# Patient Record
Sex: Female | Born: 1995 | Hispanic: No | Marital: Single
Health system: Southern US, Community
[De-identification: ages and names within clinical notes are randomized; demographics above are authoritative.]

---
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
## 2013-02-21 ENCOUNTER — Emergency Department: Payer: Self-pay | Admitting: Emergency Medicine

## 2013-02-21 LAB — URINALYSIS, COMPLETE
Bilirubin,UR: NEGATIVE
Glucose,UR: NEGATIVE mg/dL (ref 0–75)
Leukocyte Esterase: NEGATIVE
Nitrite: NEGATIVE
Ph: 6 (ref 4.5–8.0)
Protein: NEGATIVE
RBC,UR: NONE SEEN /HPF (ref 0–5)

## 2013-02-21 LAB — COMPREHENSIVE METABOLIC PANEL
Albumin: 2.4 g/dL — ABNORMAL LOW (ref 3.8–5.6)
Alkaline Phosphatase: 91 U/L
BUN: 8 mg/dL (ref 7–18)
Bilirubin,Total: 0.3 mg/dL (ref 0.2–1.0)
Calcium, Total: 8.4 mg/dL — ABNORMAL LOW (ref 9.0–10.7)
Creatinine: 0.64 mg/dL (ref 0.60–1.30)
EGFR (Non-African Amer.): >60
Glucose: 89 mg/dL (ref 65–99)
Potassium: 3.9 mmol/L (ref 3.5–5.1)
SGOT(AST): 37 U/L — ABNORMAL HIGH (ref 0–26)
SGPT (ALT): 31 U/L (ref 12–78)
Sodium: 136 mmol/L (ref 136–145)
Total Protein: 8.5 g/dL (ref 6.4–8.6)

## 2013-02-21 LAB — CBC WITH DIFFERENTIAL/PLATELET
HGB: 9.9 g/dL — ABNORMAL LOW (ref 12.0–16.0)
Lymphocyte #: 0.7 10*3/uL — ABNORMAL LOW (ref 1.0–3.6)
Monocyte #: 0.2 x10 3/mm (ref 0.2–0.9)
Platelet: 273 10*3/uL (ref 150–440)
RBC: 3.53 10*6/uL — ABNORMAL LOW (ref 3.80–5.20)

## 2013-02-21 LAB — TROPONIN I: Troponin-I: <0.02 ng/mL

## 2013-02-22 LAB — MONONUCLEOSIS SCREEN: Mono Test: NEGATIVE

## 2013-03-08 ENCOUNTER — Emergency Department: Payer: Self-pay | Admitting: Emergency Medicine

## 2013-03-08 LAB — CBC
MCV: 85 fL (ref 80–100)
RDW: 13.7 % (ref 11.5–14.5)

## 2013-03-08 LAB — COMPREHENSIVE METABOLIC PANEL
Alkaline Phosphatase: 68 U/L
BUN: 9 mg/dL (ref 7–18)
Chloride: 103 mmol/L (ref 98–107)
Co2: 31 mmol/L (ref 21–32)
Creatinine: 0.54 mg/dL — ABNORMAL LOW (ref 0.60–1.30)
EGFR (African American): >60
EGFR (Non-African Amer.): >60
Glucose: 88 mg/dL (ref 65–99)
Potassium: 4 mmol/L (ref 3.5–5.1)
SGPT (ALT): 22 U/L (ref 12–78)

## 2013-04-15 ENCOUNTER — Ambulatory Visit: Payer: Self-pay | Admitting: Rheumatology

## 2013-05-24 ENCOUNTER — Emergency Department: Payer: Self-pay | Admitting: Emergency Medicine

## 2013-05-24 LAB — CBC WITH DIFFERENTIAL/PLATELET
BASOS ABS: 0 10*3/uL (ref 0.0–0.1)
Basophil %: 0.5 %
EOS PCT: 1.5 %
Eosinophil #: 0.1 10*3/uL (ref 0.0–0.7)
HCT: 33.8 % — AB (ref 35.0–47.0)
HGB: 10.7 g/dL — ABNORMAL LOW (ref 12.0–16.0)
Lymphocyte #: 1 10*3/uL (ref 1.0–3.6)
Lymphocyte %: 20.1 %
MCH: 26.9 pg (ref 26.0–34.0)
MCHC: 31.6 g/dL — ABNORMAL LOW (ref 32.0–36.0)
MCV: 85 fL (ref 80–100)
MONOS PCT: 10.6 %
Monocyte #: 0.5 x10 3/mm (ref 0.2–0.9)
NEUTROS PCT: 67.3 %
Neutrophil #: 3.3 10*3/uL (ref 1.4–6.5)
Platelet: 233 10*3/uL (ref 150–440)
RBC: 3.97 10*6/uL (ref 3.80–5.20)
RDW: 13.9 % (ref 11.5–14.5)
WBC: 4.9 10*3/uL (ref 3.6–11.0)

## 2013-05-24 LAB — COMPREHENSIVE METABOLIC PANEL
ALBUMIN: 3.6 g/dL — AB (ref 3.8–5.6)
Alkaline Phosphatase: 69 U/L
Anion Gap: 5 — ABNORMAL LOW (ref 7–16)
BUN: 10 mg/dL (ref 7–18)
Bilirubin,Total: 0.2 mg/dL (ref 0.2–1.0)
CREATININE: 0.5 mg/dL — AB (ref 0.60–1.30)
Calcium, Total: 8.5 mg/dL — ABNORMAL LOW (ref 9.0–10.7)
Chloride: 105 mmol/L (ref 98–107)
Co2: 28 mmol/L (ref 21–32)
EGFR (African American): >60
EGFR (Non-African Amer.): >60
Glucose: 96 mg/dL (ref 65–99)
Osmolality: 275 (ref 275–301)
POTASSIUM: 4.3 mmol/L (ref 3.5–5.1)
SGOT(AST): 29 U/L — ABNORMAL HIGH (ref 0–26)
SGPT (ALT): 18 U/L (ref 12–78)
Sodium: 138 mmol/L (ref 136–145)
Total Protein: 9.5 g/dL — ABNORMAL HIGH (ref 6.4–8.6)

## 2013-05-24 LAB — URINALYSIS, COMPLETE
BILIRUBIN, UR: NEGATIVE
BLOOD: NEGATIVE
GLUCOSE, UR: NEGATIVE mg/dL (ref 0–75)
Ketone: NEGATIVE
Nitrite: NEGATIVE
Ph: 6 (ref 4.5–8.0)
RBC,UR: 26 /HPF (ref 0–5)
Specific Gravity: 1.027 (ref 1.003–1.030)
Squamous Epithelial: NONE SEEN

## 2013-11-29 ENCOUNTER — Ambulatory Visit: Payer: Self-pay | Admitting: Nephrology

## 2013-12-01 ENCOUNTER — Emergency Department: Payer: Self-pay | Admitting: Internal Medicine

## 2013-12-30 ENCOUNTER — Ambulatory Visit: Payer: Self-pay | Admitting: Nephrology

## 2013-12-30 LAB — COMPREHENSIVE METABOLIC PANEL
ALBUMIN: 2.1 g/dL — AB (ref 3.4–5.0)
ALK PHOS: 51 U/L
ANION GAP: 8 (ref 7–16)
AST: 10 U/L — AB (ref 15–37)
BUN: 13 mg/dL (ref 7–18)
Bilirubin,Total: 0.2 mg/dL (ref 0.2–1.0)
CALCIUM: 7.8 mg/dL — AB (ref 8.5–10.1)
CHLORIDE: 107 mmol/L (ref 98–107)
CREATININE: 0.75 mg/dL (ref 0.60–1.30)
Co2: 29 mmol/L (ref 21–32)
EGFR (African American): >60
EGFR (Non-African Amer.): >60
Glucose: 86 mg/dL (ref 65–99)
Osmolality: 286 (ref 275–301)
Potassium: 3.6 mmol/L (ref 3.5–5.1)
SGPT (ALT): 12 U/L — ABNORMAL LOW
Sodium: 144 mmol/L (ref 136–145)
TOTAL PROTEIN: 6.2 g/dL — AB (ref 6.4–8.2)

## 2013-12-30 LAB — URINALYSIS, COMPLETE
BILIRUBIN, UR: NEGATIVE
Blood: NEGATIVE
GLUCOSE, UR: NEGATIVE mg/dL (ref 0–75)
KETONE: NEGATIVE
Nitrite: NEGATIVE
Ph: 6 (ref 4.5–8.0)
RBC,UR: 89 /HPF (ref 0–5)
SPECIFIC GRAVITY: 1.025 (ref 1.003–1.030)
WBC UR: 64 /HPF (ref 0–5)

## 2013-12-30 LAB — CBC WITH DIFFERENTIAL/PLATELET
BASOS ABS: 0.1 10*3/uL (ref 0.0–0.1)
Basophil %: 1.2 %
EOS PCT: 0 %
Eosinophil #: 0 10*3/uL (ref 0.0–0.7)
HCT: 28.6 % — ABNORMAL LOW (ref 35.0–47.0)
HGB: 8.8 g/dL — ABNORMAL LOW (ref 12.0–16.0)
LYMPHS PCT: 22.5 %
Lymphocyte #: 1.9 10*3/uL (ref 1.0–3.6)
MCH: 26.7 pg (ref 26.0–34.0)
MCHC: 30.6 g/dL — AB (ref 32.0–36.0)
MCV: 87 fL (ref 80–100)
MONO ABS: 1.3 x10 3/mm — AB (ref 0.2–0.9)
MONOS PCT: 15.1 %
NEUTROS PCT: 61.2 %
Neutrophil #: 5.3 10*3/uL (ref 1.4–6.5)
PLATELETS: 313 10*3/uL (ref 150–440)
RBC: 3.28 10*6/uL — ABNORMAL LOW (ref 3.80–5.20)
RDW: 13.8 % (ref 11.5–14.5)
WBC: 8.7 10*3/uL (ref 3.6–11.0)

## 2013-12-30 LAB — PROTEIN / CREATININE RATIO, URINE
CREATININE, URINE: 227.3 mg/dL — AB (ref 30.0–125.0)
Protein, Random Urine: 1031 mg/dL — ABNORMAL HIGH (ref 0–12)
Protein/Creat. Ratio: 4536 mg/gCREAT — ABNORMAL HIGH (ref 0–200)

## 2013-12-30 LAB — PROTIME-INR
INR: 0.9
PROTHROMBIN TIME: 11.8 s (ref 11.5–14.7)

## 2013-12-30 LAB — APTT: ACTIVATED PTT: 24.9 s (ref 23.6–35.9)

## 2014-01-02 ENCOUNTER — Observation Stay: Payer: Self-pay | Admitting: Nephrology

## 2014-01-02 LAB — URINALYSIS, COMPLETE
Bilirubin,UR: NEGATIVE
Glucose,UR: NEGATIVE mg/dL (ref 0–75)
Ketone: NEGATIVE
NITRITE: NEGATIVE
Ph: 7 (ref 4.5–8.0)
Protein: >=500
RBC,UR: 39 /HPF (ref 0–5)
Specific Gravity: 1.011 (ref 1.003–1.030)
Squamous Epithelial: 2

## 2014-01-02 LAB — HEMOGLOBIN: HGB: 7.7 g/dL — ABNORMAL LOW (ref 12.0–16.0)

## 2014-01-03 LAB — CBC WITH DIFFERENTIAL/PLATELET
BASOS ABS: 0 10*3/uL (ref 0.0–0.1)
BASOS PCT: 0.1 %
Eosinophil #: 0 10*3/uL (ref 0.0–0.7)
Eosinophil %: 0.1 %
HCT: 26.2 % — ABNORMAL LOW (ref 35.0–47.0)
HGB: 8.2 g/dL — ABNORMAL LOW (ref 12.0–16.0)
LYMPHS ABS: 1.4 10*3/uL (ref 1.0–3.6)
Lymphocyte %: 31.8 %
MCH: 27.4 pg (ref 26.0–34.0)
MCHC: 31.4 g/dL — ABNORMAL LOW (ref 32.0–36.0)
MCV: 87 fL (ref 80–100)
MONO ABS: 0.8 x10 3/mm (ref 0.2–0.9)
Monocyte %: 18.9 %
NEUTROS PCT: 49.1 %
Neutrophil #: 2.1 10*3/uL (ref 1.4–6.5)
Platelet: 249 10*3/uL (ref 150–440)
RBC: 2.99 10*6/uL — AB (ref 3.80–5.20)
RDW: 14.2 % (ref 11.5–14.5)
WBC: 4.3 10*3/uL (ref 3.6–11.0)

## 2014-01-03 LAB — BASIC METABOLIC PANEL
ANION GAP: 6 — AB (ref 7–16)
BUN: 11 mg/dL (ref 7–18)
Calcium, Total: 7.2 mg/dL — ABNORMAL LOW (ref 8.5–10.1)
Chloride: 111 mmol/L — ABNORMAL HIGH (ref 98–107)
Co2: 28 mmol/L (ref 21–32)
Creatinine: 0.61 mg/dL (ref 0.60–1.30)
EGFR (Non-African Amer.): >60
Glucose: 102 mg/dL — ABNORMAL HIGH (ref 65–99)
Osmolality: 288 (ref 275–301)
Potassium: 4.1 mmol/L (ref 3.5–5.1)
Sodium: 145 mmol/L (ref 136–145)

## 2014-01-03 LAB — LIPID PANEL
Cholesterol: 196 mg/dL (ref 0–200)
HDL Cholesterol: 48 mg/dL (ref 40–60)
Ldl Cholesterol, Calc: 111 mg/dL — ABNORMAL HIGH (ref 0–100)
TRIGLYCERIDES: 184 mg/dL (ref 0–200)
VLDL Cholesterol, Calc: 37 mg/dL (ref 5–40)

## 2014-01-03 LAB — URINALYSIS, COMPLETE
Bilirubin,UR: NEGATIVE
Glucose,UR: NEGATIVE mg/dL (ref 0–75)
Ketone: NEGATIVE
Leukocyte Esterase: NEGATIVE
NITRITE: NEGATIVE
PH: 7 (ref 4.5–8.0)
Protein: 100
RBC,UR: 21 /HPF (ref 0–5)
Specific Gravity: 1.01 (ref 1.003–1.030)
WBC UR: 4 /HPF (ref 0–5)

## 2014-01-06 ENCOUNTER — Ambulatory Visit: Payer: Self-pay | Admitting: Oncology

## 2014-01-15 ENCOUNTER — Ambulatory Visit: Payer: Self-pay | Admitting: Oncology

## 2014-02-14 ENCOUNTER — Ambulatory Visit: Payer: Self-pay | Admitting: Hematology and Oncology

## 2014-02-22 ENCOUNTER — Inpatient Hospital Stay: Payer: Self-pay | Admitting: Internal Medicine

## 2014-02-22 LAB — COMPREHENSIVE METABOLIC PANEL
ALBUMIN: 0.9 g/dL — AB (ref 3.4–5.0)
ALK PHOS: 75 U/L
ALT: 6 U/L — AB
ANION GAP: 7 (ref 7–16)
BILIRUBIN TOTAL: 0.1 mg/dL — AB (ref 0.2–1.0)
BUN: 24 mg/dL — ABNORMAL HIGH (ref 7–18)
Calcium, Total: 6.9 mg/dL — CL (ref 8.5–10.1)
Chloride: 107 mmol/L (ref 98–107)
Co2: 25 mmol/L (ref 21–32)
Creatinine: 1.42 mg/dL — ABNORMAL HIGH (ref 0.60–1.30)
EGFR (African American): >60
GFR CALC NON AF AMER: 50 — AB
Glucose: 105 mg/dL — ABNORMAL HIGH (ref 65–99)
Osmolality: 282 (ref 275–301)
Potassium: 5.1 mmol/L (ref 3.5–5.1)
SGOT(AST): 14 U/L — ABNORMAL LOW (ref 15–37)
SODIUM: 139 mmol/L (ref 136–145)
TOTAL PROTEIN: 4.5 g/dL — AB (ref 6.4–8.2)

## 2014-02-22 LAB — CBC WITH DIFFERENTIAL/PLATELET
Basophil #: 0 10*3/uL (ref 0.0–0.1)
Basophil %: 0.3 %
EOS PCT: 0.9 %
Eosinophil #: 0.1 10*3/uL (ref 0.0–0.7)
HCT: 25.5 % — AB (ref 35.0–47.0)
HGB: 8 g/dL — ABNORMAL LOW (ref 12.0–16.0)
LYMPHS PCT: 18 %
Lymphocyte #: 1.2 10*3/uL (ref 1.0–3.6)
MCH: 27.5 pg (ref 26.0–34.0)
MCHC: 31.4 g/dL — ABNORMAL LOW (ref 32.0–36.0)
MCV: 88 fL (ref 80–100)
Monocyte #: 0.6 x10 3/mm (ref 0.2–0.9)
Monocyte %: 9.7 %
NEUTROS PCT: 71.1 %
Neutrophil #: 4.7 10*3/uL (ref 1.4–6.5)
Platelet: 236 10*3/uL (ref 150–440)
RBC: 2.9 10*6/uL — ABNORMAL LOW (ref 3.80–5.20)
RDW: 13.1 % (ref 11.5–14.5)
WBC: 6.6 10*3/uL (ref 3.6–11.0)

## 2014-02-22 LAB — URINALYSIS, COMPLETE
Bilirubin,UR: NEGATIVE
Glucose,UR: NEGATIVE mg/dL (ref 0–75)
Hyaline Cast: 28
Ketone: NEGATIVE
NITRITE: NEGATIVE
Ph: 6 (ref 4.5–8.0)
Protein: >=500
Specific Gravity: 1.015 (ref 1.003–1.030)
Squamous Epithelial: 2
WBC UR: 117 /HPF (ref 0–5)

## 2014-02-22 LAB — TROPONIN I: Troponin-I: <0.02 ng/mL

## 2014-02-23 LAB — CBC WITH DIFFERENTIAL/PLATELET
Basophil #: 0 10*3/uL (ref 0.0–0.1)
Basophil %: 0.2 %
EOS ABS: 0 10*3/uL (ref 0.0–0.7)
Eosinophil %: 0 %
HCT: 25.8 % — ABNORMAL LOW (ref 35.0–47.0)
HGB: 8.2 g/dL — AB (ref 12.0–16.0)
Lymphocyte #: 0.7 10*3/uL — ABNORMAL LOW (ref 1.0–3.6)
Lymphocyte %: 16.1 %
MCH: 27.8 pg (ref 26.0–34.0)
MCHC: 31.6 g/dL — ABNORMAL LOW (ref 32.0–36.0)
MCV: 88 fL (ref 80–100)
Monocyte #: 0.1 x10 3/mm — ABNORMAL LOW (ref 0.2–0.9)
Monocyte %: 2.3 %
Neutrophil #: 3.5 10*3/uL (ref 1.4–6.5)
Neutrophil %: 81.4 %
Platelet: 233 10*3/uL (ref 150–440)
RBC: 2.95 10*6/uL — ABNORMAL LOW (ref 3.80–5.20)
RDW: 12.9 % (ref 11.5–14.5)
WBC: 4.3 10*3/uL (ref 3.6–11.0)

## 2014-02-23 LAB — BASIC METABOLIC PANEL WITH GFR
Anion Gap: 6 — ABNORMAL LOW (ref 7–16)
BUN: 23 mg/dL — ABNORMAL HIGH (ref 7–18)
Calcium, Total: 6.7 mg/dL — CL (ref 8.5–10.1)
Chloride: 110 mmol/L — ABNORMAL HIGH (ref 98–107)
Co2: 23 mmol/L (ref 21–32)
Creatinine: 1.46 mg/dL — ABNORMAL HIGH (ref 0.60–1.30)
EGFR (African American): 59 — ABNORMAL LOW
EGFR (Non-African Amer.): 49 — ABNORMAL LOW
Glucose: 115 mg/dL — ABNORMAL HIGH (ref 65–99)
Osmolality: 282 (ref 275–301)
Potassium: 6.9 mmol/L (ref 3.5–5.1)
Sodium: 139 mmol/L (ref 136–145)

## 2014-02-23 LAB — SEDIMENTATION RATE: ERYTHROCYTE SED RATE: 106 mm/h — AB (ref 0–20)

## 2014-02-23 LAB — POTASSIUM: Potassium: 5.5 mmol/L — ABNORMAL HIGH (ref 3.5–5.1)

## 2014-02-24 LAB — CBC WITH DIFFERENTIAL/PLATELET
HCT: 26.8 % — AB (ref 35.0–47.0)
HGB: 8.3 g/dL — AB (ref 12.0–16.0)
Lymphocytes: 20 %
MCH: 27.3 pg (ref 26.0–34.0)
MCHC: 31.1 g/dL — AB (ref 32.0–36.0)
MCV: 88 fL (ref 80–100)
MONOS PCT: 5 %
Platelet: 273 10*3/uL (ref 150–440)
RBC: 3.05 10*6/uL — ABNORMAL LOW (ref 3.80–5.20)
RDW: 12.7 % (ref 11.5–14.5)
Segmented Neutrophils: 75 %
WBC: 9.6 10*3/uL (ref 3.6–11.0)

## 2014-02-24 LAB — BASIC METABOLIC PANEL
Anion Gap: 9 (ref 7–16)
BUN: 30 mg/dL — ABNORMAL HIGH (ref 7–18)
CHLORIDE: 112 mmol/L — AB (ref 98–107)
CO2: 22 mmol/L (ref 21–32)
Calcium, Total: 6.8 mg/dL — CL (ref 8.5–10.1)
Creatinine: 1.79 mg/dL — ABNORMAL HIGH (ref 0.60–1.30)
EGFR (African American): 47 — ABNORMAL LOW
EGFR (Non-African Amer.): 38 — ABNORMAL LOW
GLUCOSE: 113 mg/dL — AB (ref 65–99)
Osmolality: 292 (ref 275–301)
Potassium: 5 mmol/L (ref 3.5–5.1)
Sodium: 143 mmol/L (ref 136–145)

## 2014-02-24 LAB — LACTATE DEHYDROGENASE: LDH: 162 U/L (ref 81–246)

## 2014-02-24 LAB — IRON AND TIBC
IRON BIND. CAP.(TOTAL): 164 ug/dL — AB (ref 250–450)
IRON SATURATION: 57 %
IRON: 94 ug/dL (ref 50–170)
Unbound Iron-Bind.Cap.: 70 ug/dL

## 2014-02-24 LAB — PROTIME-INR
INR: 0.9
Prothrombin Time: 12.4 secs (ref 11.5–14.7)

## 2014-02-24 LAB — FOLATE: Folic Acid: 14.6 ng/mL (ref 3.1–17.5)

## 2014-02-24 LAB — CREATININE, URINE, RANDOM: Creatinine, Urine Random: 227.5 mg/dL — ABNORMAL HIGH (ref 30.0–125.0)

## 2014-02-24 LAB — PROTEIN URINE, QUAL

## 2014-02-24 LAB — APTT: Activated PTT: 30.9 secs (ref 23.6–35.9)

## 2014-02-28 ENCOUNTER — Ambulatory Visit: Payer: Self-pay | Admitting: Oncology

## 2014-03-17 ENCOUNTER — Ambulatory Visit: Payer: Self-pay | Admitting: Oncology

## 2014-03-17 ENCOUNTER — Ambulatory Visit: Payer: Self-pay | Admitting: Hematology and Oncology

## 2014-04-17 DEATH — deceased

## 2014-07-07 NOTE — Consult Note (Signed)
Brief Consult Note: Diagnosis: Fever, arthritis, rash, lan.   Patient was seen by consultant.   Consult note dictated.   Recommend further assessment or treatment.   Orders entered.   Discussed with Attending MD.   Comments: Check parvovirus pcr,  HIV hep b and c serologies. BCX pending RF, ANA, HLA B27 Consulted with Dr W Kernodle in rheum rec 7 day coure of doxy 100 bd defer nsaid or steroid decision to Dr Kernodle  Thank you for consult. will follow with you.  Electronic Signatures: Fitzgerald, David Patrick (MD)  (Signed 10-Apr-14 12:40)  Authored: Brief Consult Note   Last Updated: 10-Apr-14 12:40 by Fitzgerald, David Patrick (MD) 

## 2014-07-07 NOTE — Discharge Summary (Signed)
PATIENT NAMCelestia [name]:  [name], [name] MR#:  [id number] DATE OF BIRTH:  30-Mar-1995  DATE OF ADMISSION:  06/23/2012 DATE OF DISCHARGE:  06/25/2012  PRIMARY CARE PHYSICIAN: She has Medicaid, so she needs to change her primary care physician to somebody in this area. Dr. Lavenia AtlasWally Kernodle will see the patient next week in followup.   FINAL DIAGNOSES:  1. Systemic inflammatory response syndrome, fever, tachycardia.  2. 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   MEDICATIONS ON DISCHARGE: Include:  1. Doxycycline 100 mg twice a day for 3 more days.  2. Prednisone 20 mg 2 tablets daily until seen by Dr. Lavenia AtlasWally Kernodle next week.   FOLLOWUP: In 2 to 4 weeks with doctor on your Medicaid card.   REASON FOR ADMISSION: The patient was admitted 06/23/2012 and discharged 06/25/2012. Came in with fever, malaise, arthralgias, myalgias, palmar rash and erythema. Was admitted with fever of unknown origin, systemic inflammatory response syndrome. Was empirically started on doxycycline.   CONSULTANTS DURING THE HOSPITAL COURSE: Included Dr. Sampson GoonFitzgerald, infectious disease, and Dr. Gavin PottersKernodle, rheumatology.   LABORATORY AND RADIOLOGICAL DATA DURING THE HOSPITAL COURSE: Included culture that showed no beta strep. Urine culture holding for possible pathogen. Urinalysis: 2+ leukocyte esterase. Pregnancy test negative. Sedimentation rate 67. Glucose 102, BUN 8, creatinine 0.52, sodium 137, potassium 4.2, chloride 104, CO2 27, calcium 8.0, alkaline phosphatase 70, ALT 26, AST 33. White blood cell count 6.8, H and H 11.6 and 34.9, platelet count of 249. CPK 44. C-reactive protein 6.4. RPR nonreactive. ANA elevated. Anti-double-stranded DNA high at 41. RNP antibody high at 8. Smith antibody high at 4.9. Antichromatin antibody greater than 8. Blood culture is negative for 36 hours. Chest x-ray: Mild bilateral interstitial prominence suggestive of pneumonitis. Influenza A and B negative. ANA direct positive. Hepatitis C  antibody result pending. Hepatitis B surface antigen negative. ASO titer result pending. Anticardiolipin antibody is pending. Rheumatoid factor 15.6, which is high. Anti-double-stranded DNA 38. Complement C4 17, C3 101.   HOSPITAL COURSE PER PROBLEM LIST:  1. For the patient's systemic inflammatory response syndrome, fever and tachycardia, the patient's vital signs upon discharge were stable. The patient was afebrile. Heart rate normal range. Blood pressure on the lower side, but she Priyana be normally on the lower side She was empirically put on doxycycline and was suggested to stay on doxycycline for 5 days by infectious disease, although I think infection less likely here.  2. Inflammatory arthritis with myalgias, rash. The patient had a dose of Solu-Medrol and prednisone while here in the hospital and significantly improved with her grip strength and body aches. The patient will follow up with Dr. Lavenia AtlasWally Kernodle next week and stay on prednisone 40 mg daily until seen by him. This is likely lupus. Dr. Gavin PottersKernodle will review all the immunological and rheumatological information as most of the tests come back and then make a diagnosis and probably taper down the prednisone to the lowest possible dose that she can tolerate.   TIME SPENT ON DISCHARGE: 35 minutes.   CONDITION ON DISCHARGE: Improved from admission and stable,   ____________________________ Herschell Dimesichard J. Renae GlossWieting, MD rjw:OSi D: 06/25/2012 17:46:59 ET T: 06/26/2012 14:07:38 ET JOB#: [id number]  cc: Herschell Dimesichard J. Renae GlossWieting, MD, <Dictator> [name], MD Salley ScarletICHARD J Tawania Daponte MD ELECTRONICALLY SIGNED 06/26/2012 19:23

## 2014-07-07 NOTE — Consult Note (Signed)
PATIENT NAMESYNTHIA, [name] MR#:  [id number] DATE OF BIRTH:  12/21/1995  DATE OF CONSULTATION:  06/24/2012  REFERRING PHYSICIAN:      Dr. Laurin Coder, [facility] CONSULTING PHYSICIAN:  Cheral Marker. Ola Spurr, MD  REASON FOR CONSULTATION: Fever, arthritis and rash.   HISTORY OF PRESENT ILLNESS: This is a very pleasant 19 year old woman who was admitted yesterday with symptoms of 1 week of body aches, fevers and joint swelling. She states her story began 1 week ago when she fell at symptoms of body aches over. She says she felt like she could not get out of bed. She took Advil with some relief and was able to work the next few days. However, 3 to 4 days ago she developed symptoms or sore throat and some subjective fevers and sweats. She then within the last 2 days developed joint swelling, mainly in the hands. She also developed a few rashes on her hands. Her hands were swollen and she cannot close them. She has had some shaking chills and in the ED, had a fever. She also reports some headache but no neck stiffness. She has had no pain in her eyes or redness in her eyes. She has had some swollen lymph nodes on the right side of her neck. She had a mild cough and mild facial flushing but no chest pain, back pain, dysuria, nausea, vomiting or diarrhea.   Of note, she says that 3 months ago she missed a few days of work because she developed swelling in her right wrist. She went to an urgent care and was told she likely has had carpal tunnel syndrome. She was given a splint and what sounds like a prednisone taper over 12 days. This helped resolve the symptoms over a few days.   Of note, the patient is not sexually active and has not been for over 2 years. She has never had a sexually transmitted disease. She does not know of any recent sick contacts. Her family is from [place] and she has last been there in 2000 but none of her family members have been ill recently or had any tuberculosis. She has no  unusual hobbies pets.   PAST MEDICAL HISTORY:  Episode of swollen wrist several months ago.   PAST SURGICAL HISTORY:  Tonsillectomy as a child.   FAMILY HISTORY: Father has hypertension. She denies any known history of rheumatoid arthritis or rheumatologic illnesses.  Her mother does have some thyroid issues.   SOCIAL HISTORY: The patient works at [employer] and [employer]. She does not smoke or drink. She does have several body piercings which she says she got at the tattoo parlor. She has not traveled out of the country except to go to [place] in 2009. She is not sexually active since 2012. She has never had a sexually transmitted disease. She has no pets at home. She does not do much outdoor activities. She has not been camping. She denies any recent insect or tick bites. She lives with her mom, her mom's boyfriend and his family including his sister and his mother. There are a number of small nephews that come around as well. She also has a 71-monthold baby brother who lives with her.   ALLERGIES: No known drug allergies.   MEDICATIONS: She has only been taking Advil. Since admission she has been started on doxycycline.   REVIEW OF SYSTEMS:  Eleven systems were reviewed and were negative, except as per history of present illness.   PHYSICAL EXAMINATION:  VITAL SIGNS:  The Emergency Room, her temperature was 101.7. Since admission her T-max has been 98.9. Her pulse is 85, blood pressure 96/64, respirations 18, sat of 98% on room air.  GENERAL: She is pleasant, interactive, but in some pain and appears very fatigued. She has no hair loss.  HEENT: Pupils are equal, round, reactive to light and accommodation. There is no conjunctival injection. There is no icterus. No pallor.  Extraocular movements are intact without pain.  Oropharynx:  She has no tonsils but there is no erythema or oral sores.  NECK: Supple.  LYMPH SYSTEM:  She has one very large 2 cm tender lymph node in her lower right anterior  cervical chain. She also has several other shotty lymph nodes on her right cervical node and a posterior nodes.  HEART: Regular with no murmurs.  LUNGS: Clear.  ABDOMEN: Soft, nontender, nondistended. She has piercing in her umbilicus that appears intact. There is no organomegaly.  EXTREMITIES: Her bilateral lower extremities have no edema. There is no warmth or effusion over her ankles however, over her knees she does have tenderness, some warmth and some mild effusion. Her bilateral wrists and hands are quite swollen and painful with range of motion.  SKIN: She has on her right hand 2 areas that appeared to potentially have been vesicular.   NEUROLOGIC: She is alert and oriented x 3. Cranial nerves II through XII are intact. Strength is 5/5 bilateral upper extremity and lower extremity.   LABORATORY AND DIAGNOSTIC DATA:  On admission, the patient had blood work done which revealed a white blood count 6.8, hemoglobin 11.6 with an MCV of 86, platelets 249. Differential is normal at 78% neutrophils, 19% lymphs. Renal function was normal. Bicarb is 27, alkaline phosphatase is low at 70, AST slightly elevated at 33, but ALT is normal. Albumin is decreased at 2.8. Urinalysis shows 2+ leukocyte esterase, 7 white cells and 1 red cell. Pregnancy test is negative.  Strep rapid test is negative. Blood cultures x 2 are pending, but are negative. CK is 44, LDH is elevated at 374, GGT slightly elevated at 28.  Flu rapid antigen test is negative. Chest x-ray shows mild bilateral interstitial prominence suggesting pneumonitis. Repeat white count this 5.2, hemoglobin 10.5, platelets 234. Repeat LFTs showed normalization of ALT. Magnesium is 1.9. Sedimentation rate is 67.   IMPRESSION: Interesting 19 year old previously healthy and not sexually active female with an acute 1 week illness with body aches, fatigue, fever or sore throat and now joint swelling and a rash on her palms. Her sedimentation rate is elevated but  her white count is normal. She does also have lymphadenopathy in the cervical chain and had some sore throat as well. The differential for this febrile arthritis with a rash and lymphadenopathy is broad. Viral etiologies would include parvovirus B19, entero viruses such as coxsackie, mumps, rubella, although she is fully vaccinated. Cytomegalovirus is also a possibility. Hepatitis B and C can cause arthritis as well.   Bacterial causes mainly include gonococcemia although she has not been sexually active for 2 years. This does not appear to be pyogenic or septic arthritis process as her white count is normal and she has no other risk factors. It would be unusual to have any tickborne illnesses cause this since she has no exposures. Unusual causes such as Brucella, Coxiella could potentially cause this, but again would be quite unusual. From a rheumatological point of view, reactive arthritis could be involved, although she does not have a  predating illness over the last several months. Also she has no uveitis or urethritis. A poststreptococcal arthritis is also a possibility, but her strep test was negative in her throat.  Lupus and rheumatoid arthritis and other inflammatory arthritis is also a possibility. Her ESR is elevated. She did have a prior episode several months ago swelling and pain in her right wrist which resolved with steroids.   RECOMMENDATIONS:  At this point, I have ordered a viral PCR for parvovirus B19 as well as hepatitis B and C antibody testing. I also ordered an HIV test. We will check an ASO as well as rheumatoid factor. We have consulted with Dr. Jefm Bryant. He will come see her today. I checked an HLAB27 test as well. At this point I would recommend she continue her course of doxycycline for 7 days twice a day which can be given orally once she is stable to go home. She would likely benefit from NSAIDs prior to giving her a trial of steroids, although I will defer that to Dr. Jefm Bryant.    Thank you very much for the consult. I would be glad to follow with you.     ____________________________ Cheral Marker. Ola Spurr, MD dpf:ct D: 06/24/2012 12:39:03 ET T: 06/24/2012 13:02:38 ET JOB#: [id number]  cc: Cheral Marker. Ola Spurr, MD, <Dictator> Lezley Bedgood Ola Spurr MD ELECTRONICALLY SIGNED 06/24/2012 21:02

## 2014-07-07 NOTE — H&P (Signed)
PATIENT NAMECANDI, [name] MR#:  [id number] DATE OF BIRTH:  02-08-1996  DATE OF ADMISSION:  06/23/2012  PRIMARY CARE PHYSICIAN:  None.   CHIEF COMPLAINT:  Fever, malaise, arthralgias, myalgias, and palmar rash with erythema.   HISTORY OF PRESENT ILLNESS:  The patient is an 19 year old female, healthy, [place] on origin.  She was born over here in [place].  The patient has been working at a [employer] without any problems and for the past week is starting to have some mild symptoms of malaise, but within the last few days she started having some fevers on and off for what she is taking Advil continuously.  She has noticed some pain on her joints and pain on her muscles.  Also some swelling at the level of her hands, ankles.  The patient says that most of her joints hurt, but the ones that are more affected are hands and face and the ankles.  The patient started developing some swelling and tightness of her hands and legs within the past 48 hours.  She does not have a spread rash on her body, but she does have some erythema at the level of the palms.  No significant cellulitic process that I can tell.  There is some erythema at the level of the legs all throughout, mostly pink coloration, but no significant cellulitic process as well.  The patient has multiple small vesicles that appear at the level of the fingers on distal phalanges.  They are slight tender, but they do not feel like they are full of fluid.  They feel hard.  More nodular than anything else and they are white color compared with the redness of her hands.  The patient has no other rashes around her body.  She denies any recent travel.  She denies any recent sexual activity.  She states that she has had two sexual partners in all her life and she cannot remember when was her last intercourse.  She denies any STDs in the past.  She denies any rashes on her mouth or vaginal area.  There is no sick contact.  The patient does not have any  pets.  The patient is admitted for the evaluation of these conditions.  The case discussed with Dr. Ola Spurr who recommended different testing for RPR, HIV and also recommended to start doxycycline as a secure measurement.   REVIEW OF SYSTEMS:   CONSITUTIONAL:  Positive fever.  Positive myalgias.  Positive arthralgias.  Positive pain.  Positive weight loss of at least 5 pounds within the past week.  The patient states she has swelling of her arms and legs, but she denies any edema for water retention.  Her face looks edematous to me, but patient states it does not seem any different that it has been in the past.  EYES, EARS, NOSE, THROAT:  Denies any blurry vision, double vision, erythema of the eyes, pain on her eyes, conjunctivitis, swelling of her eyes.  Denies any significant dysphagia, but she has some mild to moderate odynophagia.  She denies any nasal congestion or upper respiratory symptoms.  Denies any nausea, vomiting, or diarrhea.  No abdominal pain, constipation, or melena.  RESPIRATORY:  Denies any cough, any shortness of breath or any hemoptysis.  CARDIOVASCULAR:  No chest pain.  No syncope, no orthopnea.   GENITAL:  Denies any vaginal secretions, vaginal rashes, breast masses.  LYMPHATIC:  Positive lymph nodes on the neck.  No bleeding, no easy bruising.  GENITOURINARY:  No dysuria,  hematuria, or changes in frequency.  NEUROLOGIC:  No lack of sensation, no ataxia.  No headaches.  No neck pain or neck soreness.   MOOD:  No depression, anxiety or insomnia.   ENDOCRINOLOGY:  No polyuria, polydipsia or polyphagia.  No cold or heat intolerance.  A 12 system review of systems is done.  All negative except for mentioned above.   PAST MEDICAL HISTORY:  The patient is healthy.  Does not have any previous medical history.   ALLERGIES:  The patient denies any allergies.  On the chart it says that she is ALLERGIC TO TYLENOL AND ACTIFED, but she has been taking Tylenol without problems.   PAST  SURGICAL HISTORY:  Tonsillectomy.   FAMILY HISTORY:  Positive for hypertension in her father.  Denies any cancer, coronary artery disease or rheumatologic disease.   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  She denies any tobacco or alcohol use.  The patient denies any sick contact or recent travel.  She is not currently sexually active, but all her life she has had two different partners.  No STDs.   MEDICATIONS:  Taking Advil as needed at home.   PHYSICAL EXAMINATION: VITAL SIGNS:  Blood pressure 105/79, pulse is 100, respirations 18, temperature 101.7, oxygen saturation 100% on room air.  GENERAL:  The patient is alert, oriented x 3, mild distress due to multiple pains in joints.  The patient looks acutely ill, toxic.  HEENT:  Her pupils are equal and reactive.  Extraocular movements are intact.  Mucosa are very dry.  Anicteric sclerae.  Pink conjunctivae.  Her cheeks look swollen with periorbital edema, although patient states that she looks that way all the time.  She does have erythema at the level of the cheeks, but no malar rash.  No oral lesions.  No oropharyngeal exudates.  She does not have tonsils.  There is no exudates on her oropharynx.  NECK:  Supple.  No JVD.  No thyromegaly.  Positive adenopathy, especially on the right side, tender to palpation and they are mobile.  Trachea central.  No carotid bruits.  CARDIOVASCULAR:  Regular rate and rhythm, tachycardic.  No murmurs, rubs, or gallops.  No displacement of PMI.  No tenderness to palpation anterior chest wall.  LUNGS:  Clear without any wheezing or crepitus.  No use of accessory muscles.  No dullness to percussion.  ABDOMEN:  Soft, nontender, nondistended.  No hepatosplenomegaly.  No masses.  Bowel sounds are positive.  GENITAL:  Deferred.  EXTREMITIES:  Positive edema at the level of the hands and interphalangeal joints.  There is edema and erythema in the palmar area and rashes as mentioned above  is a papular vesicular rash, not crusted, not breaking down.  Vesicles, they are all intact.  There is no other rashes, although the patient had some erythema and tightness at the level of the legs and interphalangeal joints, wrists and ankles.  I could not see any significant evidence of rash at the level of the hip or knees or elbows.  Although all those joints are tender.  NEUROLOGIC:  Cranial nerves II through XII intact.  Strength is 5 out of 5 all four extremities.  The patient feels significantly weak, subjectively.  PSYCHIATRIC:  Mood is normal without any signs of depression, but the patient looks very tired and acutely ill.  LYMPHATICS:  Positive lymphadenopathy in the neck, especially lateral chains, at the posterior chains at the level of the  right side.  No supraclavicular, axillary, no inguinal lymph nodes.  SKIN:  As mentioned above, tight skin to palpation at the level of the thighs and arms and hands.   LABORATORY, DIAGNOSTIC AND RADIOLOGICAL DATA:  Glucose is 102, BUN 8, creatinine 0.52.  Sodium is 137, potassium 4.2, CO2 is elevated at 27, anion gap is low at 6, calcium 8, total protein 8.5, albumin is 2.8, AST is slightly elevated at 33, alkaline phosphatase is low.  White blood cell is 6.8.  ESR is pending.  CRP is pending.  Hemoglobin 11.6, platelets 249.  Urinalysis has 7 white blood cells, 1 red blood cell and 4 epithelial cells.  No nitrites.  Positive leukocyte esterase +2.   Chest x-ray, no significant exudates.  No consolidations.   ASSESSMENT AND PLAN:  An 19 year old female with febrile illness.   Fever of unknown origin.  The patient has multiple symptoms including malaise, joint aches and rash.  This case is suspicious for infectious causes including varices, coxsackie, although the patient does not have any mouth ulcers.  The patient also has multiple symptoms that could be related to rheumatologic disease, since the patient is on reproductive aid, pregnancy test has been  done, is negative.  We are going to do sexually transmitted disease check, chlamydia, gonorrhea, HIV and syphilis.  As the patient has a rash on her hands and fever of unknown origin, consider rickettsia as a possibility for what antibiotics are going to be sent and the patient is going to be placed on doxycycline.  Blood cultures, urine culture has been taken.  The patient has positive leukocyte esterase and increased white blood cells in urine, although she does not have any significant symptomatology of urinary tract infection.  The patient received one dose of Rocephin in the ER.  I am not going to continue this unless we have a good estimation of where her infection is coming from.  The patient looks severely ill.  We are going to follow closely.  IV fluids given as the patient looks dehydrated.  I discussed the case with Dr. Ola Spurr who is kind enough to see the patient in the morning.   TIME SPENT:  I spent about 50 minutes with this patient today.   CODE STATUS:  The patient is a FULL CODE.     ____________________________ Emmet Sink, MD rsg:ea D: 06/23/2012 23:24:19 ET T: 06/24/2012 00:01:16 ET JOB#: [id number]  cc: [place] Sink, MD, <Dictator> Rease Wence America Brown MD ELECTRONICALLY SIGNED 06/24/2012 22:48

## 2014-07-07 NOTE — Consult Note (Signed)
PATIENT NAMEVENA, [name] MR#:  [id number] DATE OF BIRTH:  December 17, 1995  DATE OF CONSULTATION:  06/24/2012  REFERRING PHYSICIAN:   CONSULTING PHYSICIAN:  Kandyce Rud., MD  REASON FOR CONSULTATION: Arthritis.   HISTORY OF PRESENT ILLNESS: An 19 year old Asian female who grew up here, currently works at [employer]. She says that when she was in her late high school years she would have her hands and feet change color and sometimes go numb.  She saw the school nurse about this.  They sometimes would turn purple and she was told it was within normal limits. She had acute swelling in her right wrist in January, was seen by the acute care. X-ray was unremarkable. Was thought it was an inflammatory tendinitis and was given a taper of prednisone over 12 days with resolution. Since then she has had some weight loss, some leg aching.  About week ago, she felt like he started aching in her arms, thighs, and legs. She took Advil, but got to the point where she could hardly bend without being sore. She had fever at night with some chills. She developed some small discolored rash over the right second and third finger. She has not had any significant Raynaud's. She has had a little shortness of breath. She has not had any GI upset. She presented to the Emergency Room and was admitted. Upon admission she had a normal white count and platelets, but a hemoglobin of 10.5, mildly elevated LDH, and creatinine was normal. Urinalysis was nonspecific without significant proteinuria. She has not had fever, has not had any particular tachycardia, just has not felt well and feels weak and hurts with any motion.   PAST MEDICAL HISTORY:  No major illnesses, other than above.  Irregular menses. Pregnancy test negative.   REVIEW OF SYSTEMS:  As above.  PHYSICAL EXAMINATION: GENERAL: Pleasant female, ill-appearing. VITAL SIGNS: Temperature 97, pulse 85, and blood pressure 96/64.  HEENT:  There is erythematous facial  rash. Sclerae are clear. No alopecia. LYMPH:  Nonspecific cervical adenopathy.  LUNGS:  Clear chest. HEART: No murmur.  ABDOMEN: Nontender.  MUSCULOSKELETAL: Mild painful arch of motion of her shoulders. Neck moves well. Elbows without synovitis. There is tenderness to wrists. Erythema and tenderness over the MCPs, PIPs, and DIPs. The right hand has small, firm nodules over the second finger, mildly discolored. There are a few nail fold telangiectasias. Hands are generally puffy. She has decreased grip bilaterally. Back flexes well. Hips move well. Puffiness in the left and right knee without discrete effusion. MTPs are tender to palpate. Decreased range of motion of the ankles. She has symmetric reflexes and power.   IMPRESSION: Systemic illness with some features of systemic lupus with: A.  Acute inflammatory arthritis. B.  Recent inflammatory arthritis requiring steroids. C.  Facial rash. D.  Finger nodules. E.  Could be vasculitic.  F.  Intense myalgias. G.  Weight loss. H.  Interstitial lung disease on chest x-ray with mild dyspnea.   RECOMMENDATIONS:  1. Laboratories sent.  Anti-DNA complement studies, anticoagulant studies, CPK, and urine protein/creatinine ratio.  2.  Begin steroids until this illness turns around. Given her interstitial prominence and her general sickness, want to make sure she is improved while serologies are pending on steroids without a taper.   I will be happy to follow with you as well as follow up in the office. ____________________________ Kandyce Rud., MD gwk:sb D: 06/24/2012 13:41:04 ET T: 06/24/2012 14:00:31 ET JOB#: [id number]  cc:  [name], MD, <Dictator> Stann Mainlandavid P. Sampson GoonFitzgerald, MD Webb SilversmithGEORGE W KERNODLE J MD ELECTRONICALLY SIGNED 06/25/2012 12:47

## 2014-07-07 NOTE — Consult Note (Signed)
Brief Consult Note: Patient was seen by consultant.   Consult note dictated.   Orders entered.   Discussed with Attending MD.   Comments: suspect systemic inflammatory disease most consistent with lupus with acute inflammatory atthritis, facial rash, possible vascultitic finger lesions,abnl cxr, wt loss but nl creatinine.   rec C3 C4 antiDNA   begin inpatient steriods till clearly improved, I will be glad to followup.  Electronic Signatures: Royann ShiversKernodle, Jr., Helen HashimotoGeorge Wallace (MD)  (Signed 10-Apr-14 13:29)  Authored: Brief Consult Note   Last Updated: 10-Apr-14 13:29 by Royann ShiversKernodle, Jr., Helen HashimotoGeorge Wallace (MD)

## 2014-07-08 NOTE — H&P (Signed)
PATIENT NAMELILLE, [name] MR#:  [id number] DATE OF BIRTH:  1996/03/10  DATE OF ADMISSION:  02/22/2014  PRIMARY CARE PROVIDER: Dr. Jefm Bryant as well as Dr. Candiss Norse of nephrology. She has no primary care provider, that is her nephrologist and her rheumatologist, rheumatologist is Dr. Jefm Bryant, nephrologist Dr. Candiss Norse.   CHIEF COMPLAINT: Shortness of breath, chest pain.   HISTORY OF PRESENT ILLNESS: The patient is a 19 year old Asian female with history of lupus who started having shortness of breath and cough for the past few days. She has also been having fevers and started having chest congestion. Productive cough of yellowish sputum. She also started having facial swelling since yesterday and came to the ED with these symptoms. She was noted to have a possible pneumonia. She is immunocompromised  and on immunosuppressants with CellCept and Plaquenil.  The patient also complains of some nausea and vomiting, but no diarrhea. She denies any rash or any significant joint pains.   PAST MEDICAL HISTORY: History of lupus, history of lupus nephritis.   ALLERGIES: None.   MEDICATIONS: She is on enalapril 5 mg daily, hydrochloroquine 200 mg 1 tab p.o. b.i.d., CellCept 500 one tab p.o. daily, she is on prednisone 2 mg daily, and then she is on Ortho Tri-Cyclen birth control pill.     SOCIAL HISTORY: Does not smoke. Does not drink. No drugs.   FAMILY HISTORY: There is no family history of lupus.   REVIEW OF SYSTEMS:   CONSTITUTIONAL: Did not complain of fever, fatigue, weakness. No weight loss. No weight gain.  EYES: No blurred or double vision. No redness. No inflammation. No glaucoma. No cataracts.  EARS, NOSE, AND THROAT: No tinnitus. No ear pain. No hearing loss. No seasonal or year-round allergies. No epistaxis. No nasal discharge. No difficulty swallowing.  RESPIRATORY: Complains of cough. No wheezing. No hemoptysis. Complains of dyspnea.  No COPD, no TB.   CARDIOVASCULAR: Complains of chest pain  with deep breathing. No orthopnea. No edema. No arrhythmia.  GASTROINTESTINAL:  Complains of nausea, vomiting. No abdominal pain. No hematemesis. No IBS. No jaundice.  GENITOURINARY: Denies any dysuria, hematuria, renal colic, or frequency.  ENDOCRINE: Denies any polyuria, nocturia, or thyroid problems.  HEMATOLOGIC AND LYMPHATIC: History of anemia. No easy bruisability or bleeding. SKIN: No acne. No rash. Complains of just generalized swelling of the face.  MUSCULOSKELETAL: Has arthritis related to her lupus.  NEUROLOGICAL: No numbness, CVA, TIA.   PSYCHIATRIC: No anxiety, insomnia, or ADD.   PHYSICAL EXAMINATION:  VITAL SIGNS: Temperature 98.1, pulse 105, respirations 18, blood pressure 128/80.  GENERAL: The patient is a thin Asian female in no acute distress.  HEENT: Head atraumatic, normocephalic. Pupils equally round, reactive to light and accommodation. There is no conjunctival pallor. No sclera icterus. Nasal exam shows no drainage or ulceration. Oropharynx is clear without any exudate.  NECK: Supple without any thyromegaly.  CARDIOVASCULAR: Regular rate and rhythm. No murmurs, rubs, clicks, or gallops.  LUNGS: Has bilateral rhonchi in both lungs. No wheezing, no accessory muscle use.  ABDOMEN: Soft, nontender, nondistended. Positive bowel sounds x 4. No hepatosplenomegaly.  EXTREMITIES: No clubbing, cyanosis, or edema.  SKIN: She has some mild facial swelling. No erythema.  JOINTS: There is no erythema or swelling.  MUSCULOSKELETAL: There is no erythema or swelling.   NEUROLOGIC:  Awake, alert, oriented x 3. No focal deficits.  LYMPH NODES:  Nonpalpable.   PSYCHIATRIC: Not anxious or depressed.   EVALUATIONS: PA and lateral chest x-ray shows left base retrocardiac atelectasis  or infiltrate. WBC 6.6, hemoglobin 8, platelet count 236,000. Glucose 105, BUN 24, creatinine 1.42, sodium 139, potassium 5.1, chloride 107, CO2 is 25, AST 14, total protein 4.5, albumin is 0.9.    ASSESSMENT  AND PLAN: The patient is a 19 year old female with lupus nephritis and lupus, who presents with cough, shortness of breath, noted to have worsening renal function.   1.  Shortness of breath, cough due to  likely pneumonia in patient immunosuppressed. At this time, we will hold her CellCept and hydrochloroquine. Place her on IV Levaquin, follow blood cultures. Sputum cultures if possible.  2.  Systemic lupus erythematosus with possible flare. We will check ESR. Rheumatological consult.  3.  Acute renal failure on chronic renal failure. We will give her IV fluids, hold Vasotec. Nephrology consult.  4.  Severe hypoalbuminemia likely due to proteinuria as well as possible protein caloric malnutrition. Monitor her albumin. Nephrology recommendations for proteinuria.  5.  Miscellaneous. The patient will be on heparin for DVT prophylaxis.    TIME SPENT: 50 minutes.    ____________________________ Lafonda Mosses Posey Pronto, MD shp:bu D: 02/22/2014 18:49:34 ET T: 02/22/2014 19:01:03 ET JOB#: [id number]  cc: Elliemae Braman H. Posey Pronto, MD, <Dictator> Alric Seton MD ELECTRONICALLY SIGNED 03/01/2014 10:38

## 2014-07-08 NOTE — Consult Note (Signed)
Note Type Consult   HPI: Referred by Dr. Mosetta Pigeon @ [facility].   This 19 year old Female patient presents to the clinic for follow up  Lupus nephritis.  Subjective: Chief Complaint/Diagnosis:   lupus nephritis  HPI:   Patient is referred back to clinic for further evaluation and discussion of her progressive lupus nephritis. She is very edematous, but otherwise feels well. She has no neurologic complaints. She denies any fevers. She has a good appetite and denies weight loss. She has no chest pain or shortness of breath. She denies any nausea, vomiting, constipation, or diarrhea. She has no urinary complaints. Patient offers no further specific complaints today.   Review of Systems:  Performance Status (ECOG): 0  Review of Systems:   As per HPI. Otherwise, 10 point system review was negative.   Allergies:  No Known Allergies:   Smoking History: Smoking History Never Smoked.  PFSH: Additional Past Medical and Surgical History: lupus, lupus nephritis, proteinuria, tonsillectomy.    Family history: Negative and noncontributory.    Social history: Patient denies tobacco or alcohol.   Home Medications: Medication Instructions Last Modified Date/Time  hydroxychloroquine 200 mg oral tablet 1 tab(s) orally every 12 hours 18-Dec-15 16:06  levofloxacin 750 mg oral tablet 1 tab(s) orally every 48 hours x 5 days 18-Dec-15 16:06  predniSONE 20 mg oral tablet 1 tab(s) orally 2 times a day 18-Dec-15 16:06  acetaminophen 325 mg oral tablet 2 tab(s) orally every 4 hours, As needed, moderate pain (4-6/10) 18-Dec-15 16:06  lasix   new prescription, doesn't know dose 18-Dec-15 16:06   Vital Signs:  :: Ht(CM): 162 Wt(KG): 62.2 BSA: 1.6 Temp: 98.5 Pulse: 87 RR: 20 O2 Sat: 90  BP: 117/81   Physical Exam:  General: well developed, well nourished, and in no acute distress  Mental Status: normal affect  Eyes: anicteric sclera  Respiratory: clear to auscultation bilaterally   Cardiovascular: regular rate and rhythm, no murmur, rub, or gallop  Gastrointestinal: soft, nondistended, nontender, no organomegaly.  normal active bowel sounds  Musculoskeletal: 2+ edema.  Skin: No rash or petechiae noted  Neurological: alert, answering all questions appropriately.  Cranial nerves grossly intact   Lab Results Review:  Lab Results   BMP  :  24-Feb-2014 04:00:  Last Resulted Date/Time.143 K+: 5.0 CO2: 22 Chl: 112(H) BUN: 30(H) Cre: 1.79(H) Glu:113(H) Calcium: 6.8(LL) .    Assessment and Plan: Impression:   Lupus nephritis. Plan:   1. Lupus nephritis: Patient's kidney function his declining and she will likely require infusions of Cytoxan in the near future. Case was discussed with Dr. Wynelle Link and plan is to initiate treatment in approximately one month. Prior to treatment the patient will have consultation for fertility preservation at [facility]. This referral has been made, but appointment as not been scheduled. No follow-up has been scheduled at this time. Will await orders from Dr. Wynelle Link prior to proceeding. Patient expressed understanding and was in agreement with this plan. was discussed with Dr. Wynelle Link. 30 minutes was spent in discussion and consultation.  Fax to Physician:  Physicians To Recieve Fax: Mosetta Pigeon, MD - [phone].  Advance Directive:  Advance Directive Special educational needs teacher) no   Advance Directive Information Given patient refused   Electronic Signatures: Gerarda Fraction (MD)  (Signed 23-Dec-15 13:14)  Authored: Note Type, History of Present Illness, CC/HPI, Review of Systems, ALLERGIES, Smoking Cessation, Patient Family Social History, HOME MEDICATIONS, Vital Signs, Physical Exam, Lab Results Review, Assessment and Plan, Fax to Physician, Advance  Directive   Last Updated: 23-Dec-15 13:14 by Gerarda FractionFinnegan, Timothy (MD)

## 2014-07-08 NOTE — Consult Note (Signed)
Brief Consult Note: Patient was seen by consultant.   Consult note dictated.   Discussed with Attending MD.   Comments: 1 bronchopneumonia 2 SLE with crescentic GN, intolerant of cellcept, with worsening renal disease Agree with Dr Karie KirksKulloru : treat pneumonia first,cont steroids, Gyn evaluation regarding fertility, IV cytoxan Thanks for taking care of her.  Electronic Signatures: Royann ShiversKernodle, Jr., Helen HashimotoGeorge Wallace (MD)  (Signed 10-Dec-15 19:36)  Authored: Brief Consult Note   Last Updated: 10-Dec-15 19:36 by Royann ShiversKernodle, Jr., Helen HashimotoGeorge Wallace (MD)

## 2014-07-08 NOTE — Consult Note (Signed)
Note Type Consult   HPI: Referred by Dr. Mosetta Pigeon @ [facility]([id number])  This 19 year old Female patient presents to the clinic for initial evaluation of (1)Lupus nephritis(1)  Subjective: Chief Complaint/Diagnosis:   lupus nephritis requiring Solu-Medrol infusion. HPI:   Patient is a 19 year old female diagnosed with lupus several years ago, now is developing proteinuria with lupus nephritis. She currently feels well and is asymptomatic. She has no neurologic complaints. She denies any fevers. She has a good appetite and denies weight loss. She has no chest pain or shortness of breath. She denies any nausea, vomiting, constipation, or diarrhea. She has no urinary complaints. Patient offers no specific complaints today.   Review of Systems:  Performance Status (ECOG): 0  Review of Systems:   As per HPI. Otherwise, 10 point system review was negative.   Allergies:  No Known Allergies:   Smoking History: Smoking History Never Smoked.(1)  PFSH: Additional Past Medical and Surgical History: lupus, lupus nephritis, proteinuria, tonsillectomy.    Family history: Negative and noncontributory.    Social history: Patient denies tobacco or alcohol.   Home Medications: Medication Instructions Last Modified Date/Time  predniSONE 20 mg oral tablet 1 tab(s) orally every 8 hours 23-Oct-15 09:33  acetaminophen 325 mg oral tablet 2 tab(s) orally every 4 hours, As needed, moderate pain (4-6/10) 23-Oct-15 09:33  hydroxychloroquine 200 mg oral tablet 1 tab(s) orally every 12 hours 23-Oct-15 09:33  TriNessa triphasic 35 mcg oral tablet 1 tab(s) orally once a day 23-Oct-15 09:33   Vital Signs:  :: Ht(CM): 162 Wt(KG): 52.8 BSA: 1.5 Temp: 96.1 Pulse: 91 RR: 16  BP: 142/94   Physical Exam:  General: well developed, well nourished, and in no acute distress  Mental Status: normal affect  Eyes: anicteric sclera  Respiratory: clear to auscultation bilaterally  Cardiovascular: regular  rate and rhythm, no murmur, rub, or gallop  Gastrointestinal: soft, nondistended, nontender, no organomegaly.  normal active bowel sounds  Musculoskeletal: No edema  Skin: No rash or petechiae noted  Neurological: alert, answering all questions appropriately.  Cranial nerves grossly intact   Lab Results Review:  Lab Results   BMP  :  03-Jan-2014 06:30:  Last Resulted Date/Time.145 K+: 4.1 CO2: 28 Chl: 111(H) BUN: 11 Cre: 0.61 Glu:102(H) Calcium: 7.2(L) .   Review Pathology Report: Pathology Reports:     Assessment and Plan: Impression:   Lupus nephritis requiring Solu-Medrol infusion. Plan:   1. Lupus nephritis: Patient's creatinine is within normal limits, but she has significant proteinuria. Proceed with 750 mg IV Solu-Medrol today with second and third dose on Monday and Tuesday of next week. Patient reports she has followup with Dr. Thedore Mins on Wednesday, January 11, 2014. Any additional treatment or infusions will be at the request of Dr. Thedore Mins. No followup has been scheduled at this time.  Patient understands any side effects and laboratory work will be monitored by Dr. Thedore Mins. She expressed understanding and was in agreement with this plan.  Fax to Physician:  Physicians To Recieve Fax: Mosetta Pigeon, MD - [phone].  Advance Directive:  Advance Directive Special educational needs teacher) no(1)   Advance Directive Information Given patient refused(1)   Electronic Signatures: Gerarda Fraction (MD)  (Signed 23-Oct-15 09:50)  Authored: Note Type, History of Present Illness, CC/HPI, Review of Systems, ALLERGIES, Smoking Cessation, Patient Family Social History, HOME MEDICATIONS, Vital Signs, Physical Exam, Lab Results Review, Pathology Report Review, Assessment and Plan, Fax to Physician, Advance Directive   Last Updated: 23-Oct-15 09:50 by Gerarda Fraction (  MD)  References: 1.  Data Referenced From "[facility] Office Nurse Note" 06-Jan-2014 9:15 AM

## 2014-07-08 NOTE — Discharge Summary (Signed)
PATIENT NAMEBREINDEL, [name] MR#:  [id number] DATE OF BIRTH:  12-Nov-1995  DATE OF ADMISSION:  02/22/2014 DATE OF DISCHARGE:  02/24/2014  ADMISSION DIAGNOSES:  1. Shortness of breath due to pneumonia.  2. Systemic lupus erythematosus flare.  3. Acute renal failure on chronic renal failure.   DISCHARGE DIAGNOSES: 1. Community-acquired pneumonia.  2. Lupus nephritis.  3. Acute renal failure, resulting from lupus nephritis.  4. Acute cystitis.  5. Hyperkalemia from acute renal failure.  6. Protein-calorie malnutrition with low albumin.   CONSULTATIONS:  1. Nephrology.  2. Dr. Lavenia Atlas.  3. Dr. Eda Keys from oncology.   LABORATORIES AT DISCHARGE:  1. Sodium 143, potassium 5.0, chloride 112, bicarbonate 22, BUN 30, creatinine 1.79. Glucose is 113.  2. Urine creatinine id 227.5. Protein is greater than 500.  3. Unfortunately no blood cultures were taken.   PHYSICAL EXAMINATION AT DISCHARGE: VITAL SIGNS: Temperature is 97.9, pulse 72, respirations 18, blood pressure 125/88, 97% on room air.   GENERAL: The patient is alert, oriented, not in acute distress.   CARDIOVASCULAR: Regular rhythm. No murmurs, gallops or rubs. PMI is not displaced.  LUNGS: Clear to auscultation without crackles, rales, rhonchi or wheezing. Normal to percussion.   ABDOMEN: Bowel sounds are positive. Nontender, nondistended. No hepatosplenomegaly.   EXTREMITIES: No clubbing, cyanosis or edema.   HOSPITAL COURSE: This is a very pleasant, 19 year old female with a history of lupus who presented with shortness of breath, found to have a left lower lobe pneumonia on a chest x-ray. For further details, please refer to the H and P.  1. Community-acquired left lower lobe pneumonia as per chest x-ray. The patient was also on immunosuppressants at home including Plaquenil for her lupus. She was started on Levaquin. She actually did quite well with the Levaquin. She has responded well. She has no need for  oxygen at discharge.  2. Lupus nephritis with acute renal failure. The patient has developed acute renal failure with worsening of her kidney function. This is likely secondary to lupus. Dr. Wynelle Link thinks that she has failed CellCept therapy; therefore, Dr. Gavin Potters as well as oncology was consulted. The patient [name] need IV cyclophosphamide. 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 She will see Dr. Orlie Dakin next week in clinic and at that time will be referred to an infertility specialist for the possibility of preserving her eggs prior to starting this medication. It was discussed with her that it is very crucial that she follow up with nephrology, as she has had poor follow up in the past, but her kidney function is worsening due to her lupus. Dr. Wynelle Link recommended continuing on her steroids and have outpatient follow up she where she will see Dr. Thedore Mins next week.  3. Urinary tract infection with acute cystitis. The patient was on Levaquin. Urine culture was not ordered.  4. Hyperkalemia from acute renal failure. She was treated with insulin D50 Kayexalate and her potassium has improved.  5. Protein-calorie malnutrition with low albumin, likely secondary to her lupus.   DISCHARGE MEDICATIONS: 1. Tylenol 325, 2 tablets every 4 hours p.r.n. pain.  2. Prednisone 20 mg b.i.d.  3. Plaquenil 200 mg every 12 hours.  4. Levaquin 750 mg every 48 hours x 5 days. The patient will avoid any nephrotoxic agents.    DISCHARGE DIET: Regular.   DISCHARGE ACTIVITY: As tolerated.   DISCHARGE FOLLOWUP: The patient will  follow up on Monday with Dr. Orlie DakinFinnegan and Dr. Thedore MinsSingh on Monday as well. The patient will need outpatient followup with a reproductive endocrinologist.   Patient is stable for discharge.   TIME SPENT: Approximately 45 minutes.    ____________________________ Janyth ContesSital P. Juliene PinaMody,  MD spm:TT D: 02/24/2014 12:47:19 ET T: 02/24/2014 19:46:38 ET JOB#: [id number]  cc: [name] P. Juliene PinaMody, MD, <Dictator> Janyth ContesSITAL P Sharica Roedel MD ELECTRONICALLY SIGNED 02/24/2014 21:47

## 2014-07-08 NOTE — Consult Note (Signed)
PATIENT NAMCelestia [name]:  [name], [name] MR#:  [id number] DATE OF BIRTH:  [name] 25, 1997  DATE OF CONSULTATION:  02/23/2014  REFERRING PHYSICIAN:  Shreyang H. Allena KatzPatel, MD  CONSULTING PHYSICIAN:  Helen HashimotoGeorge Wallace Royann ShiversKernodle Jr., MD  REASON FOR CONSULTATION: Lupus.  HISTORY OF PRESENT ILLNESS: A 19 year old Asian female. Two years ago she developed inflammatory arthritis and Raynaud's. This year she developed abnormal urinary sediment consistent with glomerulonephritis. She had a kidney biopsy showing crescentic glomerulonephritis. She received pulse IV steroids followed by mycophenolate. She developed nausea and has not consistently taken the medicine. When she was seen a month ago she had no edema. For the last 2 days she has had cough, some productive of sputum; mild fever; did have sweat. She presented to the Emergency Room where she had x-ray evidence of atelectasis versus left lower lobe infiltrate. Creatinine rose to 1.46 and she had significant proteinuria. She was given IV steroids. She was not thrombocytopenic or neutropenic. Hemoglobin was down to 8.2. She has been given IV antibiotics with improvement. She does not feel like she has had a recent joint flare. Muscles have not bothered her. She has not had any new skin rash or new alopecia or oral ulcers; has had Raynaud's. Creatinine is worse than previous creatinine.   PAST HISTORY: Surgery negative, anemia, lupus nephritis.   SOCIAL HISTORY: Consulting civil engineertudent. No cigarettes or alcohol or illicit drugs.  FAMILY HISTORY: Negative for connective tissue disease.  REVIEW OF SYSTEMS: As above. No headache or shortness of breath.   PHYSICAL EXAMINATION:  GENERAL: Pleasant female in no acute distress. Facial fullness, diffuse edema new. Mild pale appearing.  VITAL SIGNS: Blood pressure 136/95, temperature 97, pulse 84. HEENT: Sclerae are pale. No definite skin rash. No telangiectasias. No discoid lesions of her scalp. Oropharynx is clear.  NECK: Supple.  LUNGS:  Crackles, left base; clear with cough. HEART: No murmur or rub.  ABDOMEN: Nontender.  EXTREMITIES: 2+ lower extremity edema. No synovitis, and hands, knees, hips move well.   IMPRESSION:  1.  Recent bronchopneumonia.  2.  Systemic lupus with glomerulonephritis, now worsening.  3.  Intolerance to CellCept.   RECOMMENDATIONS: Agree with Dr. Alice RiegerKolluru's note from nephrology. Treat pneumonia first. 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 Will send complement levels and anti-DNA.   ____________________________ Dessie ComaGeorge Wallace Kernodle Jr., MD gwk:ST D: 02/23/2014 19:41:48 ET T: 02/24/2014 00:57:45 ET JOB#: [id number]  cc: Dessie ComaGeorge Wallace Kernodle Jr., MD, <Dictator> Webb SilversmithGEORGE W KERNODLE J MD ELECTRONICALLY SIGNED 02/24/2014 14:10

## 2015-06-02 IMAGING — CR DG CHEST 2V
1 series · 2 of 2 positions shown · non-contrast
Comparison: 03/08/2013

CLINICAL DATA: Cough, left side chest pain, rheumatoid arthritis

EXAM:
CHEST  2 VIEW

[Series 1: dxr chest pa (or ap) and lateral · 0.14mm/px · 2 of 2 slices shown]
[im 1/2]
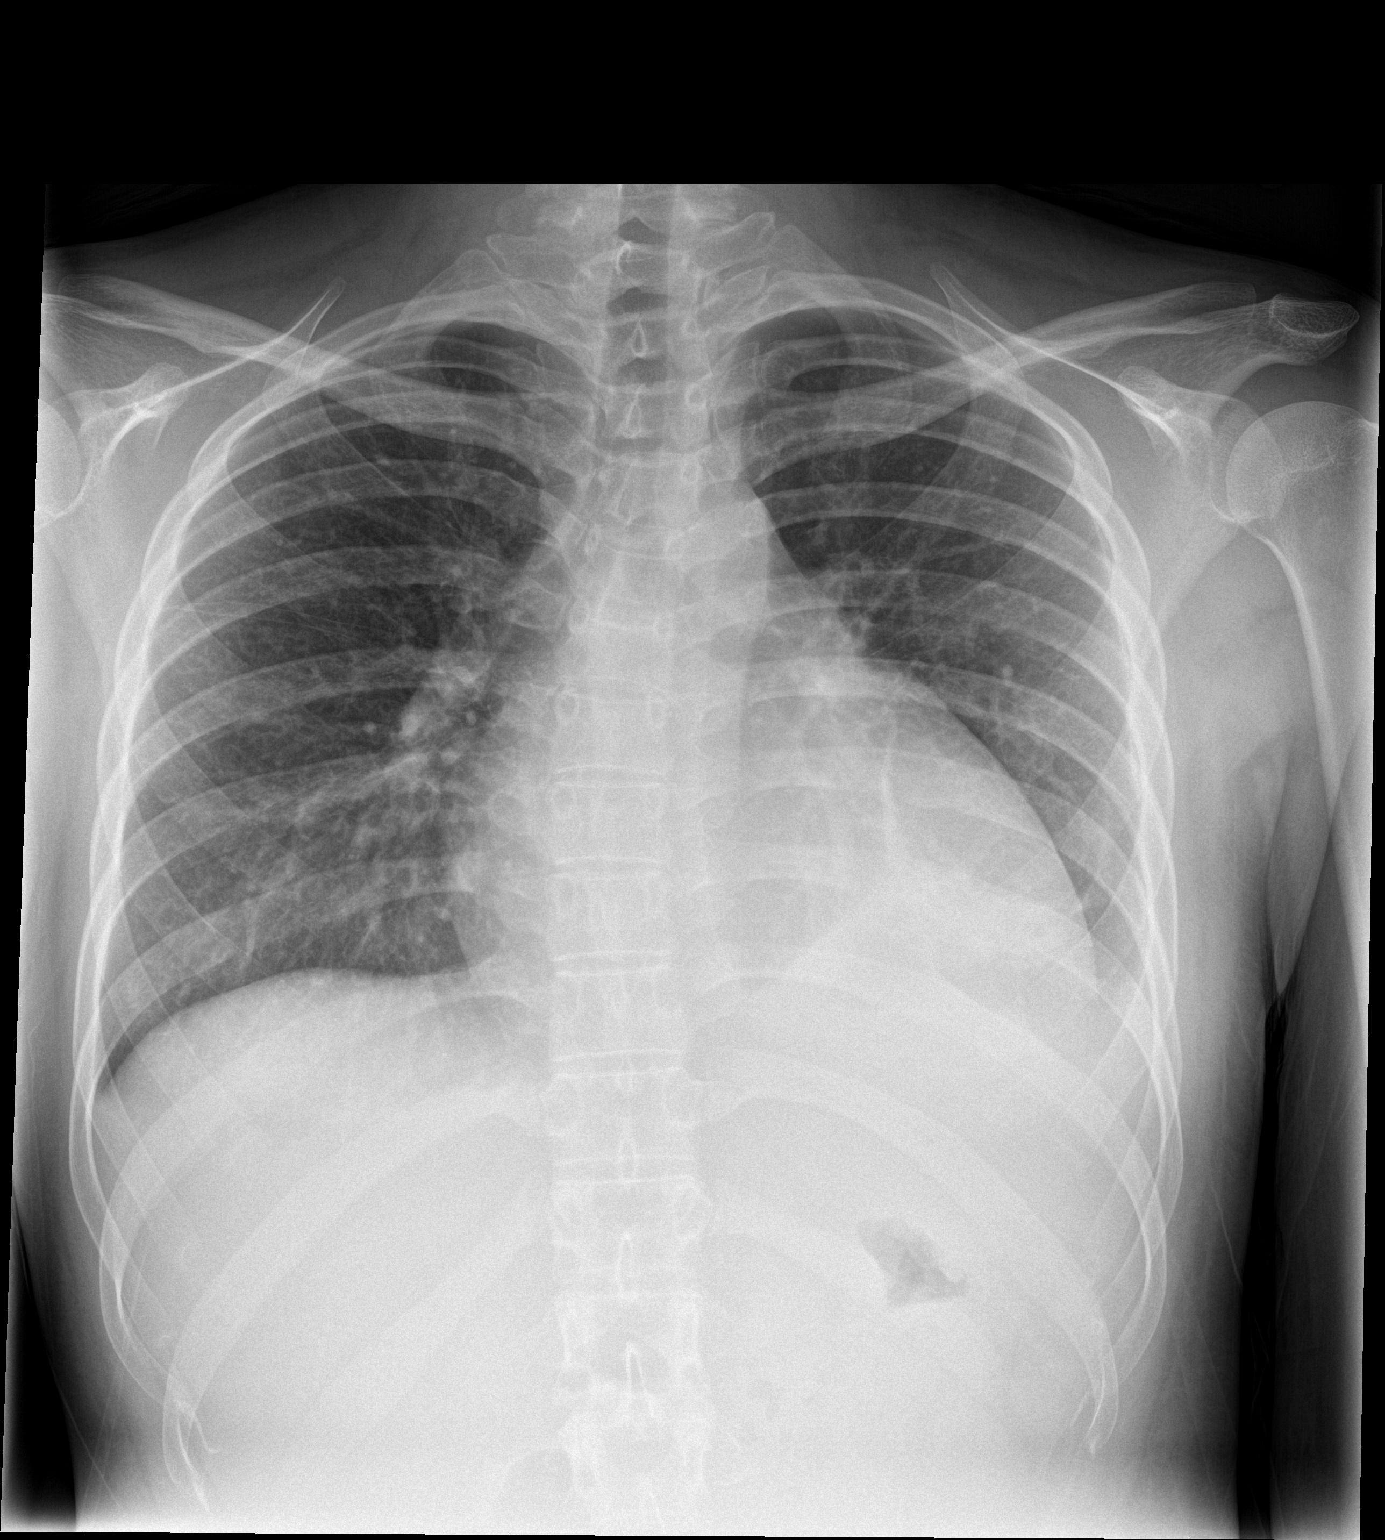
[im 2/2]
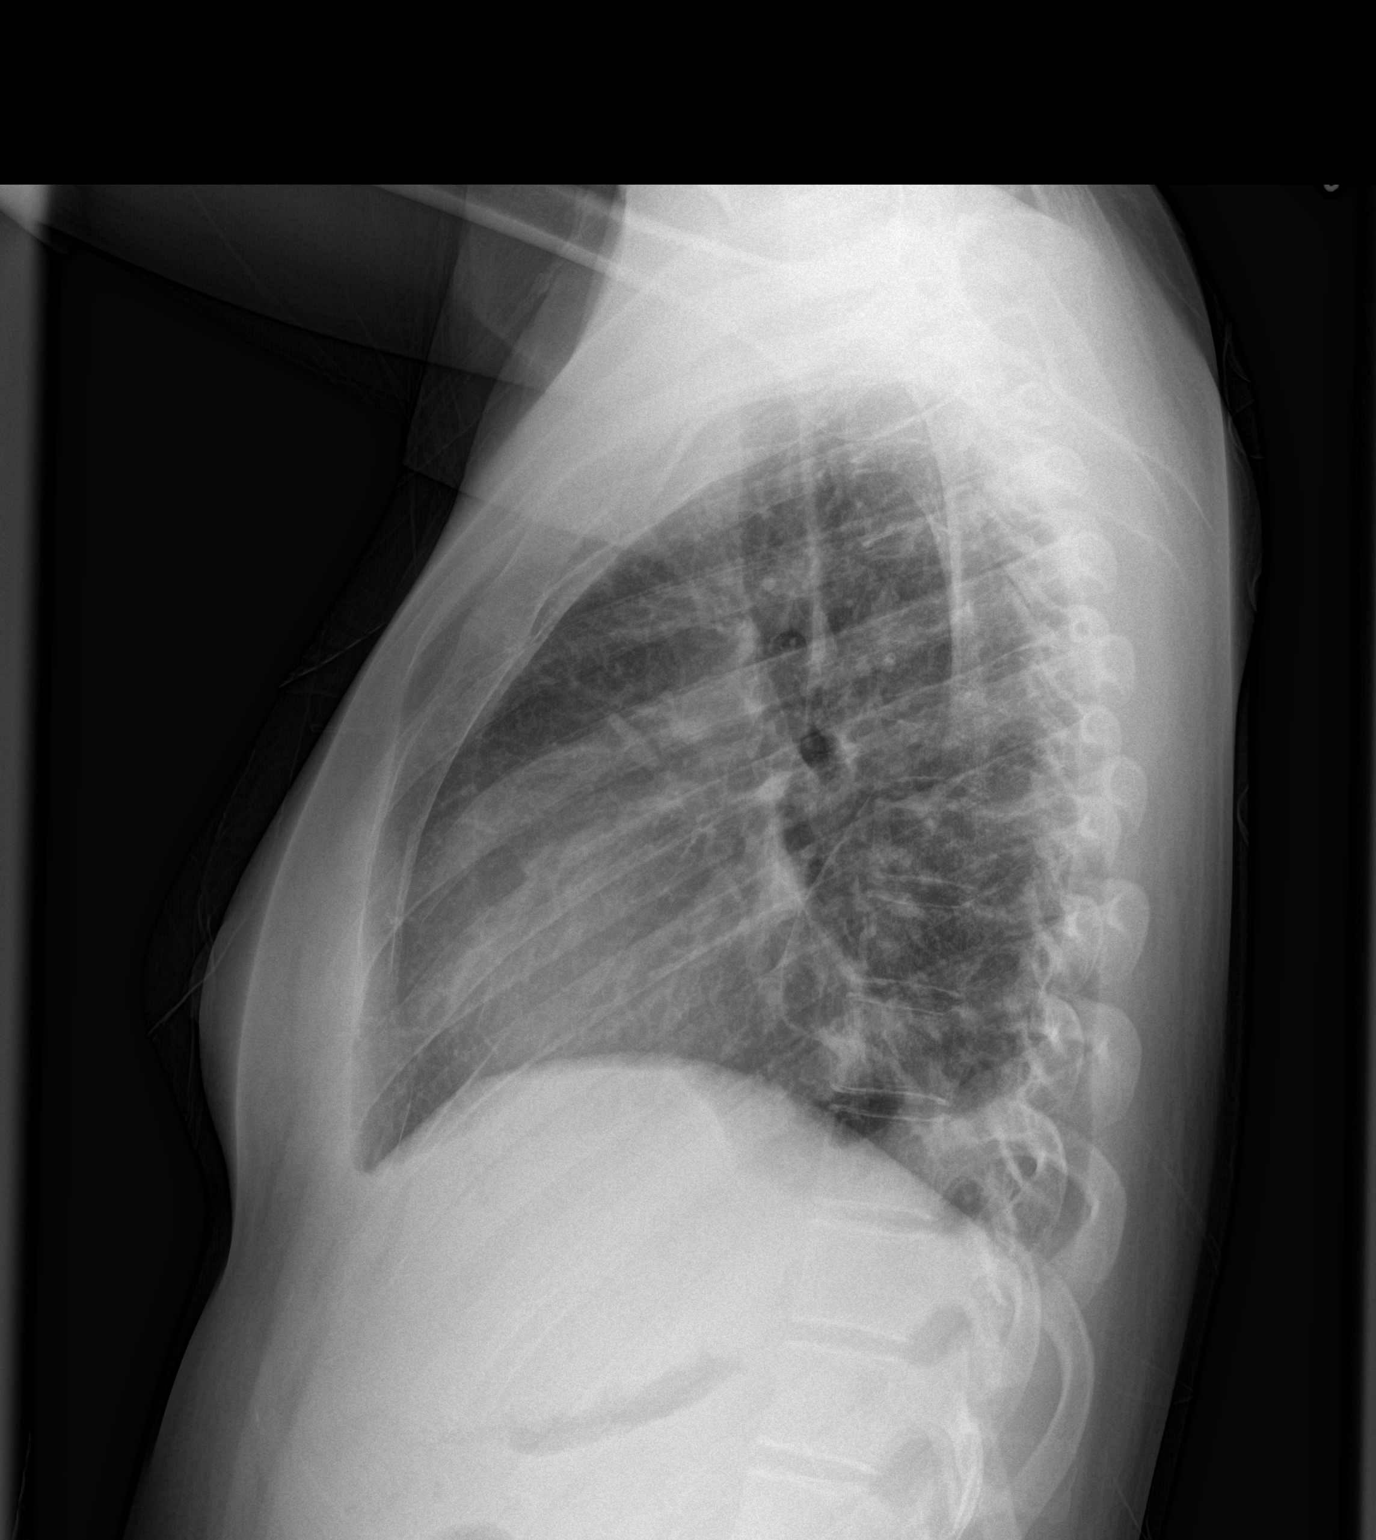

[2 of 2 positions shown; findings below may reference images not displayed]

FINDINGS: Borderline cardiomegaly. There is left base retrocardiac atelectasis
or infiltrate best seen on lateral view. Follow-up to resolution is
recommended. No pulmonary edema.
IMPRESSION: Left base retrocardiac atelectasis or infiltrate best seen on
lateral view. Follow-up to resolution is recommended. No pulmonary
edema.
